# Patient Record
Sex: Male | Born: 2000 | Race: Black or African American | Hispanic: No | Marital: Single | Smoking: Never smoker
Health system: Southern US, Community
[De-identification: ages and names within clinical notes are randomized; demographics above are authoritative.]

---
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
## 2019-10-07 ENCOUNTER — Ambulatory Visit: Payer: Self-pay | Attending: Family

## 2019-10-07 DIAGNOSIS — Z23 Encounter for immunization: Secondary | ICD-10-CM

## 2019-10-07 NOTE — Progress Notes (Signed)
   Covid-19 Vaccination Clinic  Name:  Tommy Salazar    MRN: [id number] DOB: [birth year]  10/07/2019  [name] was observed post Covid-19 immunization for 15 minutes without incident. He was provided with Vaccine Information Sheet and instruction to access the V-Safe system.   [name] was instructed to call 911 with any severe reactions post vaccine: Marland Kitchen Difficulty breathing  . Swelling of face and throat  . A fast heartbeat  . A bad rash all over body  . Dizziness and weakness   Immunizations Administered    Name Date Dose VIS Date Route   Moderna COVID-19 Vaccine 10/07/2019  1:30 PM 0.5 mL 06/01/2019 Intramuscular   Manufacturer: Moderna   Lot: [id number]   NDC: 10932-355-73

## 2019-11-09 ENCOUNTER — Ambulatory Visit: Payer: Self-pay | Attending: Family

## 2019-11-09 DIAGNOSIS — Z23 Encounter for immunization: Secondary | ICD-10-CM

## 2019-11-09 NOTE — Progress Notes (Signed)
   Covid-19 Vaccination Clinic  Name:  Matheo Rathbone    MRN: [id number] DOB: Jul 25, 2000  11/09/2019  Mr. Rosiles was observed post Covid-19 immunization for 15 minutes without incident. He was provided with Vaccine Information Sheet and instruction to access the V-Safe system.   Mr. Kiss was instructed to call 911 with any severe reactions post vaccine: Marland Kitchen Difficulty breathing  . Swelling of face and throat  . A fast heartbeat  . A bad rash all over body  . Dizziness and weakness   Immunizations Administered    Name Date Dose VIS Date Route   Moderna COVID-19 Vaccine 11/09/2019 11:01 AM 0.5 mL 06/2019 Intramuscular   Manufacturer: Moderna   Lot: [id number]   NDC: 09470-962-83

## 2020-11-05 ENCOUNTER — Other Ambulatory Visit: Payer: Self-pay

## 2020-11-05 ENCOUNTER — Ambulatory Visit
Admission: EM | Admit: 2020-11-05 | Discharge: 2020-11-05 | Disposition: A | Discharge status: Home / Self Care | Payer: Managed Care, Other (non HMO) | Attending: Emergency Medicine | Admitting: Emergency Medicine

## 2020-11-05 DIAGNOSIS — J069 Acute upper respiratory infection, unspecified: Secondary | ICD-10-CM | POA: Insufficient documentation

## 2020-11-05 LAB — GROUP A STREP BY PCR: Group A Strep by PCR: NOT DETECTED

## 2020-11-05 MED ORDER — PROMETHAZINE-DM 6.25-15 MG/5ML PO SYRP: 5.0000 mL | ORAL_SOLUTION | Freq: Four times a day (QID) | ORAL | 0 refills | Status: DC | PRN

## 2020-11-05 MED ORDER — IPRATROPIUM BROMIDE 0.06 % NA SOLN: 2.0000 | Freq: Four times a day (QID) | NASAL | 12 refills | Status: AC

## 2020-11-05 MED ORDER — BENZONATATE 100 MG PO CAPS: 200.0000 mg | ORAL_CAPSULE | Freq: Three times a day (TID) | ORAL | 0 refills | Status: DC

## 2020-11-05 NOTE — ED Provider Notes (Signed)
[facility]    CSN: [id number] Arrival date & time: 11/05/20  0912      History   Chief Complaint Chief Complaint  Patient presents with  . Sore Throat    HPI Tommy Salazar is a 20 y.o. male.   HPI   20 year old male here for evaluation of sore throat.  Patient reports that he has had a sore throat for last 2 days and then it hurts to swallow.  He reports associated symptoms of runny nose, nasal congestion, nonproductive cough, and wheezing.  He denies fever, sinus pain or pressure, ear pain or pressure, shortness of breath, or GI complaints.  History reviewed. No pertinent past medical history.  Patient Active Problem List   Diagnosis Date Noted  . Acute pain of left knee 10/08/2016    History reviewed. No pertinent surgical history.     Home Medications    Prior to Admission medications   Medication Sig Start Date End Date Taking? Authorizing Provider  benzonatate (TESSALON) 100 MG capsule Take 2 capsules (200 mg total) by mouth every 8 (eight) hours. 11/05/20  Yes Becky Augusta, NP  ipratropium (ATROVENT) 0.06 % nasal spray Place 2 sprays into both nostrils 4 (four) times daily. 11/05/20  Yes Becky Augusta, NP  promethazine-dextromethorphan (PROMETHAZINE-DM) 6.25-15 MG/5ML syrup Take 5 mLs by mouth 4 (four) times daily as needed. 11/05/20  Yes Becky Augusta, NP  amoxicillin (AMOXIL) 400 MG/5ML suspension Take 5 mLs (400 mg total) by mouth 3 (three) times daily. Patient not taking: Reported on 09/20/2016 06/16/13   Linna Hoff, MD    Family History History reviewed. No pertinent family history.  Social History Social History   Tobacco Use  . Smoking status: Never Smoker  . Smokeless tobacco: Never Used  Substance Use Topics  . Alcohol use: No     Allergies   Patient has no known allergies.   Review of Systems Review of Systems  Constitutional: Negative for activity change, appetite change and fever.  HENT: Positive for congestion, rhinorrhea  and sore throat. Negative for ear pain, sinus pressure and sinus pain.   Respiratory: Positive for cough and wheezing. Negative for shortness of breath.   Gastrointestinal: Negative for abdominal pain, diarrhea, nausea and vomiting.  Musculoskeletal: Negative for arthralgias and myalgias.  Skin: Negative for rash.  Hematological: Negative.   Psychiatric/Behavioral: Negative.      Physical Exam Triage Vital Signs ED Triage Vitals [11/05/20 0950]  Enc Vitals Group     BP 122/73     Pulse Rate 86     Resp 16     Temp 99 F (37.2 C)     Temp Source Oral     SpO2 100 %     Weight 195 lb (88.5 kg)     Height 6\' 1"  (1.854 m)     Head Circumference      Peak Flow      Pain Score 8     Pain Loc      Pain Edu?      Excl. in GC?    No data found.  Updated Vital Signs BP 122/73   Pulse 86   Temp 99 F (37.2 C) (Oral)   Resp 16   Ht 6\' 1"  (1.854 m)   Wt 195 lb (88.5 kg)   SpO2 100%   BMI 25.73 kg/m   Visual Acuity Right Eye Distance:   Left Eye Distance:   Bilateral Distance:    Right Eye Near:  Left Eye Near:    Bilateral Near:     Physical Exam Vitals and nursing note reviewed.  Constitutional:      General: He is not in acute distress.    Appearance: He is well-developed and normal weight. He is not ill-appearing.  HENT:     Head: Normocephalic and atraumatic.     Right Ear: Tympanic membrane and ear canal normal. Tympanic membrane is not erythematous.     Left Ear: Tympanic membrane and ear canal normal. Tympanic membrane is not erythematous.     Nose: Congestion and rhinorrhea present.     Mouth/Throat:     Mouth: Mucous membranes are moist.     Pharynx: Oropharynx is clear. Uvula midline. Posterior oropharyngeal erythema present.     Tonsils: 0 on the right. 0 on the left.  Cardiovascular:     Rate and Rhythm: Normal rate and regular rhythm.     Heart sounds: Normal heart sounds. No murmur heard. No gallop.   Pulmonary:     Effort: Pulmonary effort is  normal.     Breath sounds: Normal breath sounds. No wheezing, rhonchi or rales.  Musculoskeletal:     Cervical back: Normal range of motion and neck supple.  Lymphadenopathy:     Cervical: No cervical adenopathy.  Skin:    General: Skin is warm and dry.     Capillary Refill: Capillary refill takes less than 2 seconds.     Findings: No erythema or rash.  Neurological:     General: No focal deficit present.     Mental Status: He is alert and oriented to person, place, and time.  Psychiatric:        Mood and Affect: Mood normal.        Behavior: Behavior normal.      UC Treatments / Results  Labs (all labs ordered are listed, but only abnormal results are displayed) Labs Reviewed  GROUP A STREP BY PCR    EKG   Radiology No results found.  Procedures Procedures (including critical care time)  Medications Ordered in UC Medications - No data to display  Initial Impression / Assessment and Plan / UC Course  I have reviewed the triage vital signs and the nursing notes.  Pertinent labs & imaging results that were available during my care of the patient were reviewed by me and considered in my medical decision making (see chart for details).   Patient is a very pleasant, nontoxic-appearing 20 year old male here for evaluation of sore throat that is been going on for last couple days and is associated with runny nose, nasal congestion, nonproductive cough, and wheezing.  Patient denies fever, sinus pain or pressure, ear pain or pressure, shortness of breath or GI complaints.  Physical exam reveals mildly ceruminous external auditory canals bilaterally but I am able to visualize both tympanic membranes which are pearly gray.  Nasal mucosa is erythematous and edematous with clear nasal discharge.  Oropharyngeal exam reveals normal tonsillar pillars without erythema, edema, or exudate.  Posterior oropharynx is erythematous with clear postnasal drip.  No cervical lymphadenopathy  appreciated on exam.  Lungs are clear to auscultation all fields.  Strep PCR collected at triage which is negative.  Patient's physical exam is consistent with upper respiratory infection, most likely viral.  Will treat with ipratropium nasal spray, Tessalon Perles, and Promethazine DM cough syrup.  Work note provided.   Final Clinical Impressions(s) / UC Diagnoses   Final diagnoses:  Viral URI with cough  Discharge Instructions     Use the Atrovent nasal spray, 2 squirts in each nostril every 6 hours, as needed for runny nose and postnasal drip.  Use the Tessalon Perles every 8 hours during the day.  Take them with a small sip of water.  They may give you some numbness to the base of your tongue or a metallic taste in your mouth, this is normal.  Use the Promethazine DM cough syrup at bedtime for cough and congestion.  It will make you drowsy so do not take it during the day.  Return for reevaluation or see your primary care provider for any new or worsening symptoms.     ED Prescriptions    Medication Sig Dispense Auth. Provider   ipratropium (ATROVENT) 0.06 % nasal spray Place 2 sprays into both nostrils 4 (four) times daily. 15 mL Becky Augusta, NP   benzonatate (TESSALON) 100 MG capsule Take 2 capsules (200 mg total) by mouth every 8 (eight) hours. 21 capsule Becky Augusta, NP   promethazine-dextromethorphan (PROMETHAZINE-DM) 6.25-15 MG/5ML syrup Take 5 mLs by mouth 4 (four) times daily as needed. 118 mL Becky Augusta, NP     PDMP not reviewed this encounter.   Becky Augusta, NP 11/05/20 1045

## 2020-11-05 NOTE — Discharge Instructions (Addendum)

## 2020-11-05 NOTE — ED Triage Notes (Signed)
Pt reports having sore throat that began on Friday. sts it is painful when swallowing.

## 2021-01-28 ENCOUNTER — Other Ambulatory Visit: Payer: Self-pay

## 2021-01-28 ENCOUNTER — Emergency Department
Admission: EM | Admit: 2021-01-28 | Discharge: 2021-01-28 | Disposition: A | Discharge status: Home / Self Care | Payer: Managed Care, Other (non HMO) | Attending: Emergency Medicine | Admitting: Emergency Medicine

## 2021-01-28 ENCOUNTER — Emergency Department: Payer: Managed Care, Other (non HMO)

## 2021-01-28 DIAGNOSIS — R109 Unspecified abdominal pain: Secondary | ICD-10-CM | POA: Diagnosis present

## 2021-01-28 DIAGNOSIS — N200 Calculus of kidney: Secondary | ICD-10-CM | POA: Diagnosis not present

## 2021-01-28 LAB — COMPREHENSIVE METABOLIC PANEL
ALT: 18 U/L (ref 0–44)
AST: 18 U/L (ref 15–41)
Albumin: 4.6 g/dL (ref 3.5–5.0)
Alkaline Phosphatase: 48 U/L (ref 38–126)
Anion gap: 7 (ref 5–15)
BUN: 10 mg/dL (ref 6–20)
CO2: 25 mmol/L (ref 22–32)
Calcium: 9.4 mg/dL (ref 8.9–10.3)
Chloride: 106 mmol/L (ref 98–111)
Creatinine, Ser: 1.05 mg/dL (ref 0.61–1.24)
GFR, Estimated: >60 mL/min (ref >60)
Glucose, Bld: 97 mg/dL (ref 70–99)
Potassium: 3.7 mmol/L (ref 3.5–5.1)
Sodium: 138 mmol/L (ref 135–145)
Total Bilirubin: 1.3 mg/dL — ABNORMAL HIGH (ref 0.3–1.2)
Total Protein: 7.6 g/dL (ref 6.5–8.1)

## 2021-01-28 LAB — URINALYSIS, COMPLETE (UACMP) WITH MICROSCOPIC
Bilirubin Urine: NEGATIVE
Glucose, UA: NEGATIVE mg/dL
Ketones, ur: NEGATIVE mg/dL
Leukocytes,Ua: NEGATIVE
Nitrite: NEGATIVE
Protein, ur: 30 mg/dL — AB
RBC / HPF: >50 RBC/hpf — ABNORMAL HIGH (ref 0–5)
Specific Gravity, Urine: 1.028 (ref 1.005–1.030)
Squamous Epithelial / LPF: NONE SEEN (ref 0–5)
pH: 6 (ref 5.0–8.0)

## 2021-01-28 LAB — CBC WITH DIFFERENTIAL/PLATELET
Abs Immature Granulocytes: 0.01 10*3/uL (ref 0.00–0.07)
Basophils Absolute: 0 10*3/uL (ref 0.0–0.1)
Basophils Relative: 0 %
Eosinophils Absolute: 0.1 10*3/uL (ref 0.0–0.5)
Eosinophils Relative: 2 %
HCT: 42.5 % (ref 39.0–52.0)
Hemoglobin: 14.3 g/dL (ref 13.0–17.0)
Immature Granulocytes: 0 %
Lymphocytes Relative: 33 %
Lymphs Abs: 1.4 10*3/uL (ref 0.7–4.0)
MCH: 29.5 pg (ref 26.0–34.0)
MCHC: 33.6 g/dL (ref 30.0–36.0)
MCV: 87.8 fL (ref 80.0–100.0)
Monocytes Absolute: 0.6 10*3/uL (ref 0.1–1.0)
Monocytes Relative: 14 %
Neutro Abs: 2.1 10*3/uL (ref 1.7–7.7)
Neutrophils Relative %: 51 %
Platelets: 214 10*3/uL (ref 150–400)
RBC: 4.84 MIL/uL (ref 4.22–5.81)
RDW: 13 % (ref 11.5–15.5)
WBC: 4.3 10*3/uL (ref 4.0–10.5)
nRBC: 0 % (ref 0.0–0.2)

## 2021-01-28 MED ORDER — HYDROCODONE-ACETAMINOPHEN 5-325 MG PO TABS: 1.0000 | ORAL_TABLET | Freq: Four times a day (QID) | ORAL | 0 refills | Status: AC | PRN

## 2021-01-28 MED ORDER — ONDANSETRON HCL 4 MG/2ML IJ SOLN
4.0000 mg | Freq: Once | INTRAMUSCULAR | Status: AC
  Administered 2021-01-28: 4 mg via INTRAVENOUS
  Filled 2021-01-28: qty 2

## 2021-01-28 MED ORDER — KETOROLAC TROMETHAMINE 30 MG/ML IJ SOLN
30.0000 mg | Freq: Once | INTRAMUSCULAR | Status: DC
  Filled 2021-01-28: qty 1

## 2021-01-28 MED ORDER — ONDANSETRON 4 MG PO TBDP: 4.0000 mg | ORAL_TABLET | Freq: Three times a day (TID) | ORAL | 0 refills | Status: AC | PRN

## 2021-01-28 MED ORDER — TAMSULOSIN HCL 0.4 MG PO CAPS: 0.4000 mg | ORAL_CAPSULE | Freq: Every day | ORAL | 0 refills | Status: AC

## 2021-01-28 MED ORDER — SODIUM CHLORIDE 0.9 % IV BOLUS
1000.0000 mL | Freq: Once | INTRAVENOUS | Status: AC
  Administered 2021-01-28: 1000 mL via INTRAVENOUS

## 2021-01-28 MED ORDER — MORPHINE SULFATE (PF) 4 MG/ML IV SOLN
4.0000 mg | Freq: Once | INTRAVENOUS | Status: AC
  Administered 2021-01-28: 4 mg via INTRAVENOUS
  Filled 2021-01-28: qty 1

## 2021-01-28 MED ORDER — KETOROLAC TROMETHAMINE 30 MG/ML IJ SOLN
30.0000 mg | Freq: Once | INTRAMUSCULAR | Status: AC
  Administered 2021-01-28: 30 mg via INTRAVENOUS

## 2021-01-28 NOTE — ED Provider Notes (Signed)
[facility] Emergency Department Provider Note   ____________________________________________   Event Date/Time   First MD Initiated Contact with Patient 01/28/21 [phone]     (approximate)  I have reviewed the triage vital signs and the nursing notes.   HISTORY  Chief Complaint Abdominal Pain    HPI Tommy Salazar is a 20 y.o. male presents to the ED via EMS from home with complaint of left-sided flank pain and abdominal pain that woke him up this morning.  Patient denies any vomiting, fever or chills.  There is some mild nausea.  Patient denies any injury or urinary frequency.  No prior history of kidney stones.  Currently rates pain as 10/10.       History reviewed. No pertinent past medical history.  Patient Active Problem List   Diagnosis Date Noted   Acute pain of left knee 10/08/2016    History reviewed. No pertinent surgical history.  Prior to Admission medications   Medication Sig Start Date End Date Taking? Authorizing Provider  HYDROcodone-acetaminophen (NORCO/VICODIN) 5-325 MG tablet Take 1 tablet by mouth every 6 (six) hours as needed for moderate pain. 01/28/21 01/28/22 Yes Kalif Kattner L, PA-C  ondansetron (ZOFRAN ODT) 4 MG disintegrating tablet Take 1 tablet (4 mg total) by mouth every 8 (eight) hours as needed for nausea or vomiting. 01/28/21  Yes Tommi Rumps, PA-C  tamsulosin (FLOMAX) 0.4 MG CAPS capsule Take 1 capsule (0.4 mg total) by mouth daily. 01/28/21  Yes Bridget Hartshorn L, PA-C  ipratropium (ATROVENT) 0.06 % nasal spray Place 2 sprays into both nostrils 4 (four) times daily. 11/05/20   Becky Augusta, NP    Allergies Patient has no known allergies.  History reviewed. No pertinent family history.  Social History Social History   Tobacco Use   Smoking status: Never   Smokeless tobacco: Never  Substance Use Topics   Alcohol use: No    Review of Systems Constitutional: No fever/chills Eyes: No visual  changes. Cardiovascular: Denies chest pain. Respiratory: Denies shortness of breath. Gastrointestinal: No abdominal pain.  Positive nausea, no vomiting.  Genitourinary: Negative for dysuria.  Positive left flank pain. Musculoskeletal: Negative for musculoskeletal pain. Skin: Negative for rash. Neurological: Negative for headaches, focal weakness or numbness.   ____________________________________________   PHYSICAL EXAM:  VITAL SIGNS: ED Triage Vitals  Enc Vitals Group     BP 01/28/21 0853 (!) 150/90     Pulse Rate 01/28/21 0853 90     Resp 01/28/21 0853 18     Temp 01/28/21 0853 98 F (36.7 C)     Temp Source 01/28/21 0853 Oral     SpO2 01/28/21 0853 100 %     Weight 01/28/21 0854 210 lb (95.3 kg)     Height 01/28/21 0854 6' (1.829 m)     Head Circumference --      Peak Flow --      Pain Score 01/28/21 0853 10     Pain Loc --      Pain Edu? --      Excl. in GC? --     Constitutional: Alert and oriented. Well appearing and in no acute distress. Eyes: Conjunctivae are normal.  Head: Atraumatic. Neck: No stridor.   Cardiovascular: Normal rate, regular rhythm. Grossly normal heart sounds.  Good peripheral circulation. Respiratory: Normal respiratory effort.  No retractions. Lungs CTAB. Gastrointestinal: Soft and nontender. No distention.  Tenderness on the left flank area is present.  Sounds normoactive x4 quadrants. Musculoskeletal: Patient is able  move upper and lower extremities without any difficulty.  Antalgic gait is noted secondary to his discomfort. Neurologic:  Normal speech and language. No gross focal neurologic deficits are appreciated. No gait instability. Skin:  Skin is warm, dry and intact. No rash noted. Psychiatric: Mood and affect are normal. Speech and behavior are normal.  ____________________________________________   LABS (all labs ordered are listed, but only abnormal results are displayed)  Labs Reviewed  URINALYSIS, COMPLETE (UACMP) WITH  MICROSCOPIC - Abnormal; Notable for the following components:      Result Value   Color, Urine YELLOW (*)    APPearance HAZY (*)    Hgb urine dipstick LARGE (*)    Protein, ur 30 (*)    RBC / HPF >50 (*)    Bacteria, UA RARE (*)    All other components within normal limits  COMPREHENSIVE METABOLIC PANEL - Abnormal; Notable for the following components:   Total Bilirubin 1.3 (*)    All other components within normal limits  CBC WITH DIFFERENTIAL/PLATELET   ____________________________________________ ___________________________________________  RADIOLOGY Beaulah Corin, personally viewed and evaluated these images (plain radiographs) as part of my medical decision making, as well as reviewing the written report by the radiologist.   Official radiology report(s): CT Renal Stone Study  Result Date: 01/28/2021 CLINICAL DATA:  Left-sided flank pain. EXAM: CT ABDOMEN AND PELVIS WITHOUT CONTRAST TECHNIQUE: Multidetector CT imaging of the abdomen and pelvis was performed following the standard protocol without IV contrast. COMPARISON:  None. FINDINGS: Lower chest: No acute abnormality. Evaluation of the abdominal viscera limited by lack of IV contrast. Hepatobiliary: No focal liver abnormality is seen (the very top portion of the hepatic dome is excluded from field of view). Normal appearance of the gallbladder. Pancreas: Unremarkable. Spleen: Normal in size without focal abnormality. Adrenals/Urinary Tract: Adrenal glands are unremarkable. Normal appearance of the right kidney. There is mild left hydronephrosis and diffuse ureterectasis. There is a punctate calculus within the bladder. There are no additional bilateral renal calculi. No large mass lesion. Stomach/Bowel: Stomach is within normal limits. Appendix appears normal. No evidence of bowel wall thickening, distention, or inflammatory changes. Vascular/Lymphatic: Vascular patency cannot be assessed in the absence IV contrast. No enlarged  abdominal or pelvic lymph nodes. Reproductive: Prostate is unremarkable. Other: No abdominal wall hernia or abnormality. No abdominopelvic ascites. Musculoskeletal: No acute or significant osseous findings. IMPRESSION: There is a punctate calculus within the bladder, likely representing a recently passed stone. Mild left hydronephrosis and ureterectasis. Electronically Signed   By: Emmaline Kluver M.D.   On: 01/28/2021 10:19    ____________________________________________   PROCEDURES  Procedure(s) performed (including Critical Care):  Procedures   ____________________________________________   INITIAL IMPRESSION / ASSESSMENT AND PLAN / ED COURSE  As part of my medical decision making, I reviewed the following data within the electronic MEDICAL RECORD NUMBER Notes from prior ED visits and [place] Controlled Substance Database  20 year old male presents to the ED with complaint of left flank pain that woke him early this morning.  Patient has some nausea without any vomiting.  He was given Zofran and morphine 4 mg IV.  Urinalysis was suspicious for a stone and CT scan confirms that there has been a stone most likely on the left side which is now in the bladder.  Patient was given Toradol 30 mg IV and was feeling much better prior to discharge.  He will follow-up with his PCP and also the name of the urologist on-call was given to  him.  Patient is encouraged to drink lots of fluids today.  Prescriptions were sent to his pharmacy and also a note for work should he need it. ____________________________________________   FINAL CLINICAL IMPRESSION(S) / ED DIAGNOSES  Final diagnoses:  Kidney stone on left side     ED Discharge Orders          Ordered    ondansetron (ZOFRAN ODT) 4 MG disintegrating tablet  Every 8 hours PRN        01/28/21 1027    HYDROcodone-acetaminophen (NORCO/VICODIN) 5-325 MG tablet  Every 6 hours PRN        01/28/21 1027    tamsulosin (FLOMAX) 0.4 MG CAPS capsule  Daily         01/28/21 1041             Note:  This document was prepared using Dragon voice recognition software and may include unintentional dictation errors.    Tommi Rumps, PA-C 01/28/21 1133    Chesley Noon, MD 01/29/21 [phone]

## 2021-01-28 NOTE — ED Triage Notes (Signed)
Pt comes ems from home with left sided flank and abd pain that woke him up this morning.

## 2021-01-28 NOTE — ED Notes (Signed)
Taken to CT.

## 2021-01-28 NOTE — Discharge Instructions (Addendum)
Follow-up with your regular doctor if any continued problems.  If you continue to have kidney stones follow-up with Dr. Alvester Morin who is on-call for urology this weekend.  Medication was sent to your pharmacy.  1 is for nausea or vomiting every 8 hours (Zofran).  The hydrocodone should be taken only if needed for pain.  This medication could cause drowsiness and increase your risk for injury.  The Flomax is 1 daily.  Increase fluids.  Hopefully this is the only stone you develop in the near future.  A work note was written in the event that she needed it.

## 2021-03-31 ENCOUNTER — Encounter: Payer: Self-pay | Admitting: Emergency Medicine

## 2021-03-31 ENCOUNTER — Emergency Department
Admission: EM | Admit: 2021-03-31 | Discharge: 2021-03-31 | Disposition: A | Discharge status: Home / Self Care | Payer: Managed Care, Other (non HMO) | Attending: Emergency Medicine | Admitting: Emergency Medicine

## 2021-03-31 ENCOUNTER — Other Ambulatory Visit: Payer: Self-pay

## 2021-03-31 DIAGNOSIS — S61214A Laceration without foreign body of right ring finger without damage to nail, initial encounter: Secondary | ICD-10-CM | POA: Diagnosis not present

## 2021-03-31 DIAGNOSIS — W268XXA Contact with other sharp object(s), not elsewhere classified, initial encounter: Secondary | ICD-10-CM | POA: Diagnosis not present

## 2021-03-31 DIAGNOSIS — S61212A Laceration without foreign body of right middle finger without damage to nail, initial encounter: Secondary | ICD-10-CM

## 2021-03-31 DIAGNOSIS — S6991XA Unspecified injury of right wrist, hand and finger(s), initial encounter: Secondary | ICD-10-CM | POA: Diagnosis present

## 2021-03-31 MED ORDER — LIDOCAINE-EPINEPHRINE-TETRACAINE (LET) TOPICAL GEL
3.0000 mL | Freq: Once | TOPICAL | Status: AC
  Administered 2021-03-31: 3 mL via TOPICAL
  Filled 2021-03-31: qty 3

## 2021-03-31 NOTE — ED Notes (Signed)
Rn to bedside. Pt in no acute distress. Unbandaging wound. Pt has small abrasion to finger,

## 2021-03-31 NOTE — Discharge Instructions (Addendum)
Keep the wound clean, dry, and covered. Avoid any lotion, cream, ointment over the glue.

## 2021-03-31 NOTE — ED Triage Notes (Signed)
Pt reports cut his right hand 4th digit on a muffler last pm and he cannot get it to stop bleeding. Pt unsure of when his last tetanus was.

## 2021-04-01 NOTE — ED Provider Notes (Signed)
[facility] Emergency Department Provider Note ____________________________________________  Time seen: 1128  I have reviewed the triage vital signs and the nursing notes.  HISTORY  Chief Complaint  Laceration   HPI Tommy Salazar is a 20 y.o. male presents to the ED for evaluation of a superficial laceration to his right ring finger from work last night.  Patient was working in [employer] last night, when he accidentally shaved the side of the finger.  He presents this morning due to continued slow bleeding from the area.  He denies any other injury at this time.  History reviewed. No pertinent past medical history.  Patient Active Problem List   Diagnosis Date Noted   Acute pain of left knee 10/08/2016    History reviewed. No pertinent surgical history.  Prior to Admission medications   Medication Sig Start Date End Date Taking? Authorizing Provider  HYDROcodone-acetaminophen (NORCO/VICODIN) 5-325 MG tablet Take 1 tablet by mouth every 6 (six) hours as needed for moderate pain. 01/28/21 01/28/22  Bridget Hartshorn L, PA-C  ipratropium (ATROVENT) 0.06 % nasal spray Place 2 sprays into both nostrils 4 (four) times daily. 11/05/20   Becky Augusta, NP  ondansetron (ZOFRAN ODT) 4 MG disintegrating tablet Take 1 tablet (4 mg total) by mouth every 8 (eight) hours as needed for nausea or vomiting. 01/28/21   Tommi Rumps, PA-C  tamsulosin (FLOMAX) 0.4 MG CAPS capsule Take 1 capsule (0.4 mg total) by mouth daily. 01/28/21   Tommi Rumps, PA-C    Allergies Patient has no known allergies.  History reviewed. No pertinent family history.  Social History Social History   Tobacco Use   Smoking status: Never   Smokeless tobacco: Never  Substance Use Topics   Alcohol use: No    Review of Systems  Constitutional: Negative for fever. Eyes: Negative for visual changes. ENT: Negative for sore throat. Cardiovascular: Negative for chest pain. Respiratory:  Negative for shortness of breath. Gastrointestinal: Negative for abdominal pain, vomiting and diarrhea. Genitourinary: Negative for dysuria. Musculoskeletal: Negative for back pain. Skin: Negative for rash.  Right ring finger laceration as above. Neurological: Negative for headaches, focal weakness or numbness. ____________________________________________  PHYSICAL EXAM:  VITAL SIGNS: ED Triage Vitals  Enc Vitals Group     BP 03/31/21 1012 125/81     Pulse Rate 03/31/21 1012 71     Resp 03/31/21 1012 18     Temp 03/31/21 1012 98.2 F (36.8 C)     Temp Source 03/31/21 1012 Oral     SpO2 03/31/21 1012 100 %     Weight 03/31/21 1008 190 lb (86.2 kg)     Height 03/31/21 1008 6' (1.829 m)     Head Circumference --      Peak Flow --      Pain Score 03/31/21 1008 4     Pain Loc --      Pain Edu? --      Excl. in GC? --     Constitutional: Alert and oriented. Well appearing and in no distress. Head: Normocephalic and atraumatic. Eyes: Conjunctivae are normal. Normal extraocular movements Cardiovascular: Normal rate, regular rhythm. Normal distal pulses. Respiratory: Normal respiratory effort. Musculoskeletal: Normal composite fist on the right.  Patient with a superficial shave laceration to the radial aspect of his ring finger.  No nailbed injury or avulsion is noted.  Nontender with normal range of motion in all extremities.  Neurologic:  Normal gait without ataxia. Normal speech and language. No gross focal  neurologic deficits are appreciated. Skin:  Skin is warm, dry and intact. No rash noted. Psychiatric: Mood and affect are normal. Patient exhibits appropriate insight and judgment. ____________________________________________    {LABS (pertinent positives/negatives)  ____________________________________________  {EKG  ____________________________________________   RADIOLOGY Official radiology report(s): No results  found. ____________________________________________  PROCEDURES  Wound glue applied to open 0.5 cm shave laceration. Disposable bandage applied.  Procedures ____________________________________________   INITIAL IMPRESSION / ASSESSMENT AND PLAN / ED COURSE  As part of my medical decision making, I reviewed the following data within the electronic MEDICAL RECORD NUMBER Notes from prior ED visits    Patient ED evaluation of a superficial shave laceration to his right ring finger.  Patient presents with still active bleeding to the area.  The wound is cleansed and a glue patch is applied.  Patient is discharged with wound care instructions.  Tommy Salazar was evaluated in Emergency Department on 04/01/2021 for the symptoms described in the history of present illness. He was evaluated in the context of the global COVID-19 pandemic, which necessitated consideration that the patient might be at risk for infection with the SARS-CoV-2 virus that causes COVID-19. Institutional protocols and algorithms that pertain to the evaluation of patients at risk for COVID-19 are in a state of rapid change based on information released by regulatory bodies including the CDC and federal and state organizations. These policies and algorithms were followed during the patient's care in the ED. ____________________________________________  FINAL CLINICAL IMPRESSION(S) / ED DIAGNOSES  Final diagnoses:  Laceration of right ring finger without foreign body without damage to nail, initial encounter      Lissa Hoard, PA-C 04/01/21 1114    Dionne Bucy, MD 04/01/21 1223

## 2023-04-04 IMAGING — CT CT RENAL STONE PROTOCOL
2 of 4 series · 16 of 46 positions shown, 18 images · non-contrast
Comparison: None.

CLINICAL DATA: Left-sided flank pain.

EXAM:
CT ABDOMEN AND PELVIS WITHOUT CONTRAST
TECHNIQUE: Multidetector CT imaging of the abdomen and pelvis was performed
following the standard protocol without IV contrast.

[Series 2: stone full standard · axial · 0.79mm/px · z∈[-1066,-626]mm · 13 of 97 slices shown, 15 images]
[im 5/97  soft-tissue]
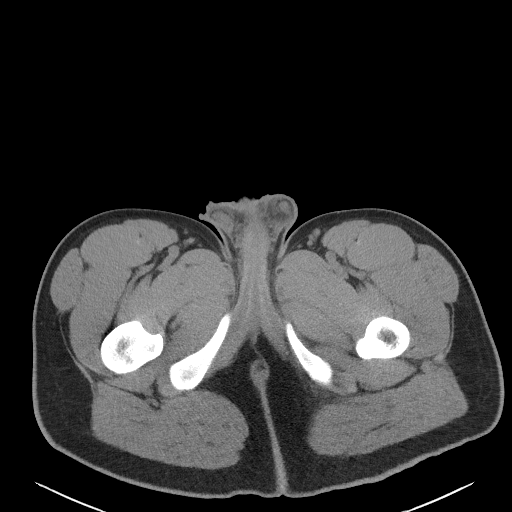
[im 5/97  bone]
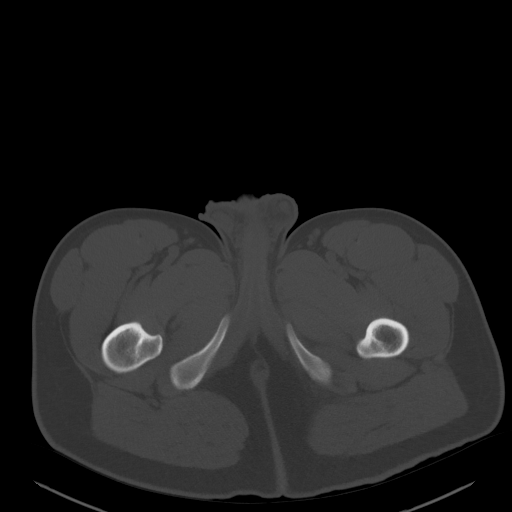
[im 13/97  soft-tissue]
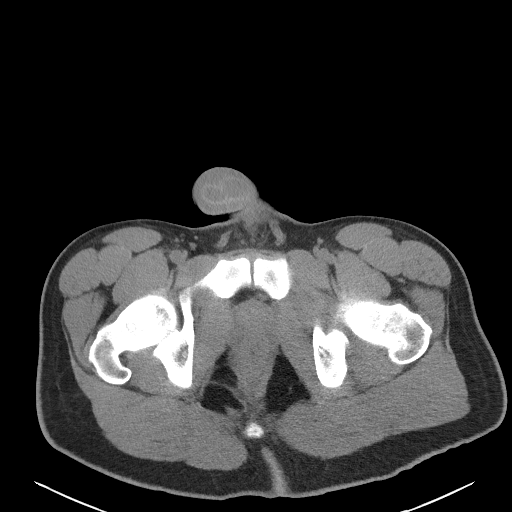
[im 21/97  soft-tissue]
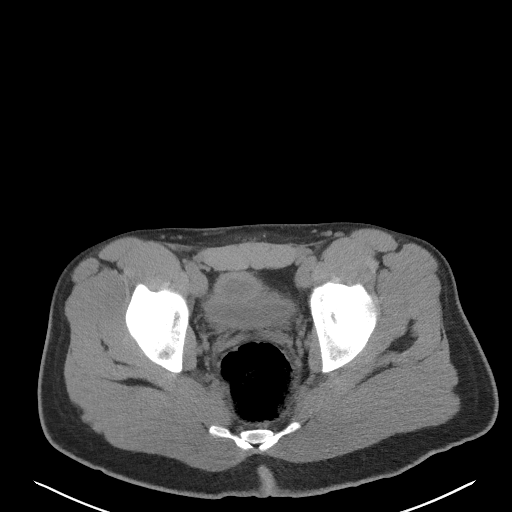
[im 29/97  soft-tissue]
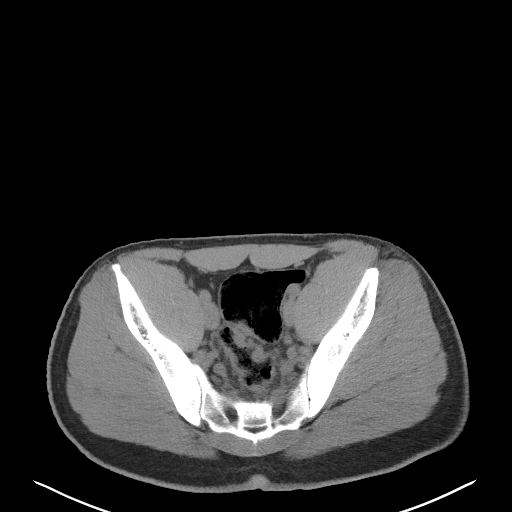
[im 33/97  soft-tissue]
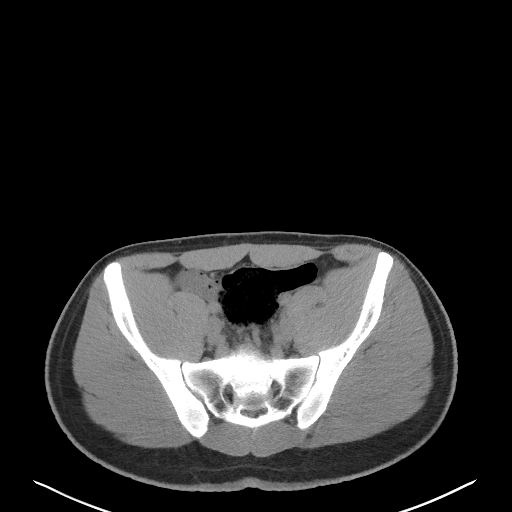
[im 41/97  soft-tissue]
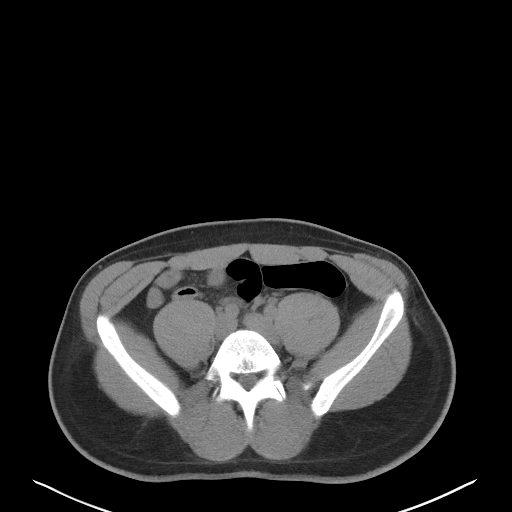
[im 49/97  soft-tissue]
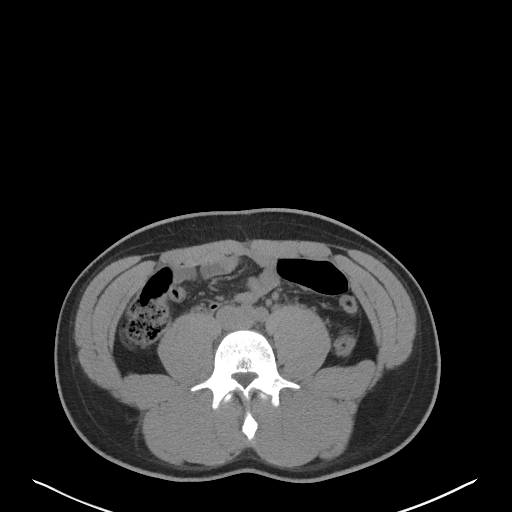
[im 57/97  soft-tissue]
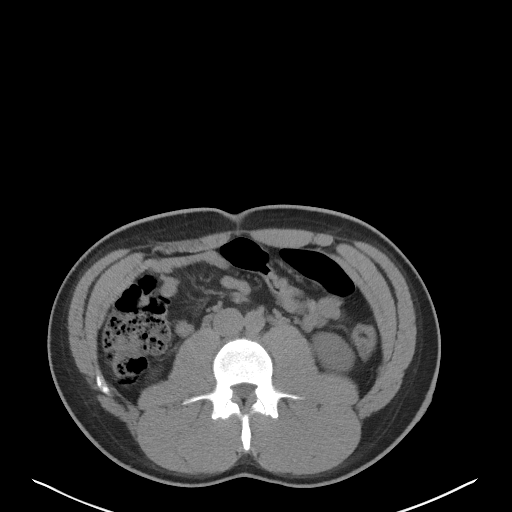
[im 65/97  soft-tissue]
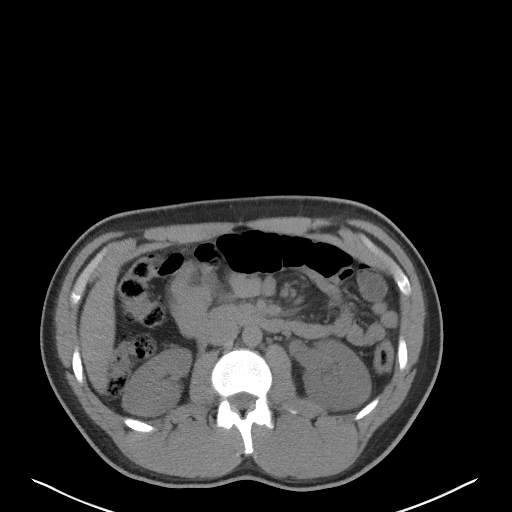
[im 65/97  bone]
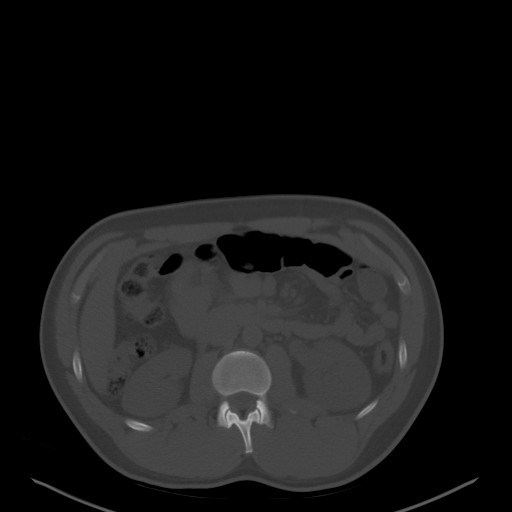
[im 69/97  soft-tissue]
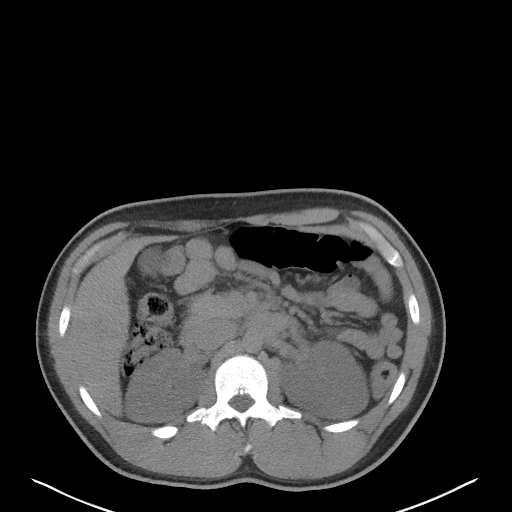
[im 77/97  soft-tissue]
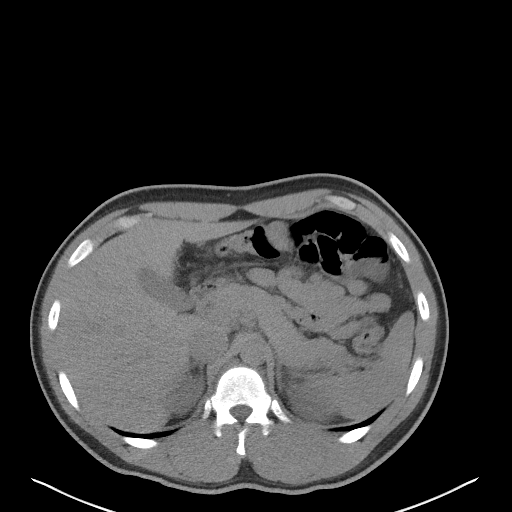
[im 85/97  soft-tissue]
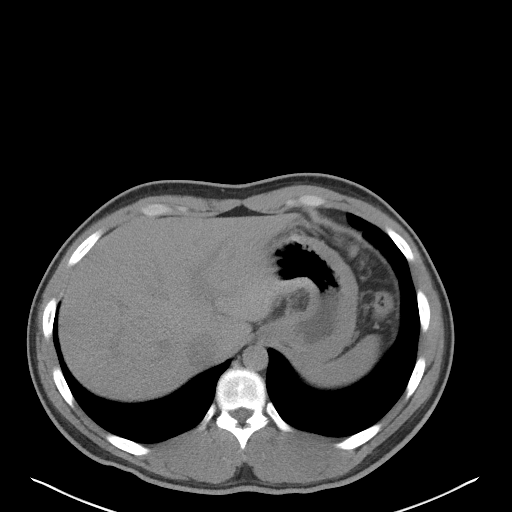
[im 93/97  soft-tissue]
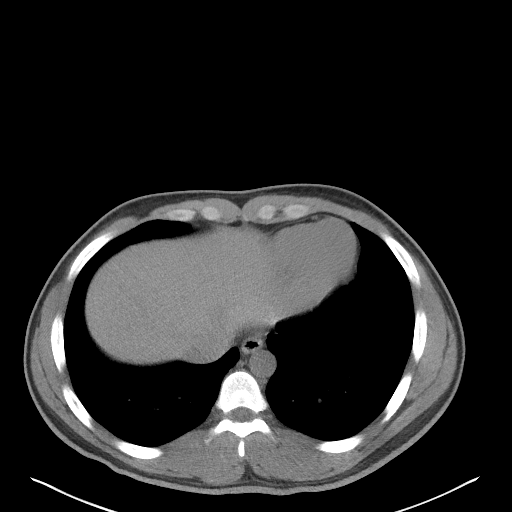

[Series 5: coronal · coronal · 0.70mm/px · 3 of 132 slices shown]
[im 44/132  soft-tissue]
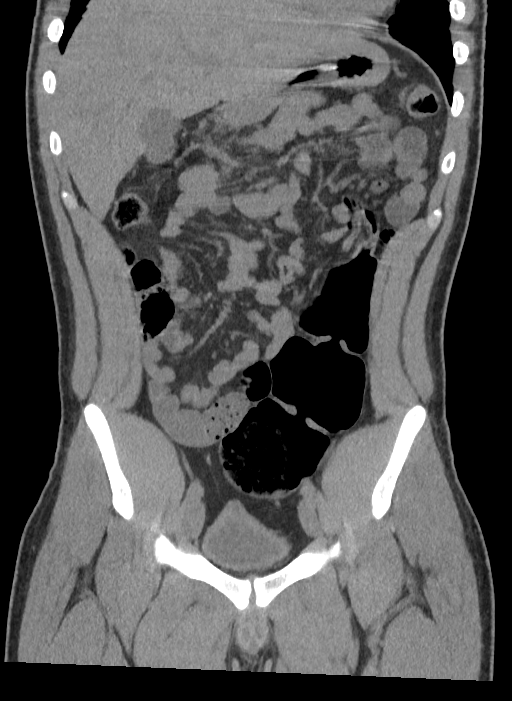
[im 59/132  soft-tissue]
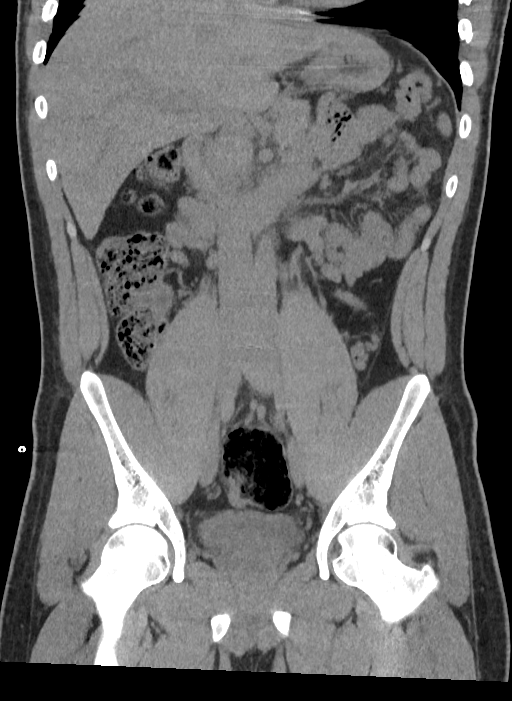
[im 73/132  soft-tissue]
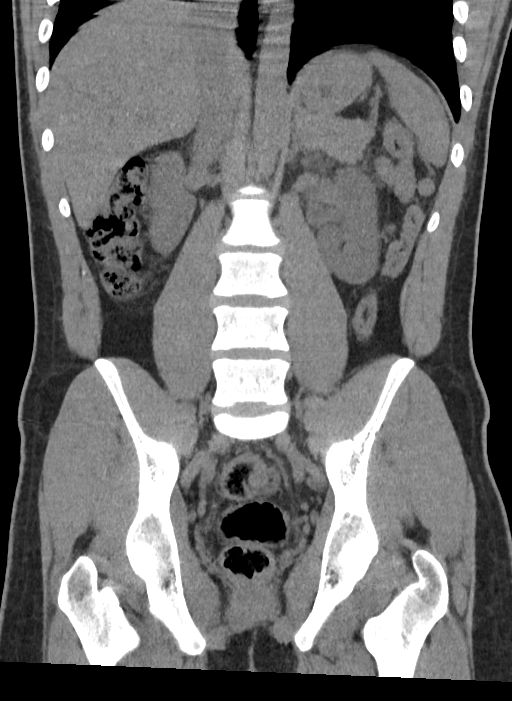

[16 of 46 positions shown; findings below may reference images not displayed]

FINDINGS: Lower chest: No acute abnormality.

Evaluation of the abdominal viscera limited by lack of IV contrast.

Hepatobiliary: No focal liver abnormality is seen (the very top
portion of the hepatic dome is excluded from field of view). Normal
appearance of the gallbladder.

Pancreas: Unremarkable.

Spleen: Normal in size without focal abnormality.

Adrenals/Urinary Tract: Adrenal glands are unremarkable. Normal
appearance of the right kidney. There is mild left hydronephrosis
and diffuse ureterectasis. There is a punctate calculus within the
bladder. There are no additional bilateral renal calculi. No large
mass lesion.

Stomach/Bowel: Stomach is within normal limits. Appendix appears
normal. No evidence of bowel wall thickening, distention, or
inflammatory changes.

Vascular/Lymphatic: Vascular patency cannot be assessed in the
absence IV contrast. No enlarged abdominal or pelvic lymph nodes.

Reproductive: Prostate is unremarkable.

Other: No abdominal wall hernia or abnormality. No abdominopelvic
ascites.

Musculoskeletal: No acute or significant osseous findings.
IMPRESSION: There is a punctate calculus within the bladder, likely representing
a recently passed stone. Mild left hydronephrosis and ureterectasis.
